# Patient Record
Sex: Female | Born: 1975 | Race: White | Hispanic: No | Marital: Single | Smoking: Current some day smoker
Health system: Southern US, Community
[De-identification: ages and names within clinical notes are randomized; demographics above are authoritative.]

---

## 2013-02-26 ENCOUNTER — Encounter (HOSPITAL_COMMUNITY): Payer: Self-pay | Admitting: Emergency Medicine

## 2013-02-26 ENCOUNTER — Inpatient Hospital Stay (HOSPITAL_COMMUNITY)
Admission: EM | Admit: 2013-02-26 | Discharge: 2013-03-02 | DRG: 158 | Disposition: A | Discharge status: Home / Self Care | Payer: PRIVATE HEALTH INSURANCE | Attending: General Surgery | Admitting: General Surgery

## 2013-02-26 ENCOUNTER — Emergency Department (HOSPITAL_COMMUNITY): Payer: PRIVATE HEALTH INSURANCE

## 2013-02-26 DIAGNOSIS — S8010XA Contusion of unspecified lower leg, initial encounter: Secondary | ICD-10-CM

## 2013-02-26 DIAGNOSIS — S92901A Unspecified fracture of right foot, initial encounter for closed fracture: Secondary | ICD-10-CM | POA: Diagnosis present

## 2013-02-26 DIAGNOSIS — R404 Transient alteration of awareness: Secondary | ICD-10-CM | POA: Diagnosis present

## 2013-02-26 DIAGNOSIS — S93409A Sprain of unspecified ligament of unspecified ankle, initial encounter: Secondary | ICD-10-CM

## 2013-02-26 DIAGNOSIS — S025XXA Fracture of tooth (traumatic), initial encounter for closed fracture: Secondary | ICD-10-CM | POA: Diagnosis present

## 2013-02-26 DIAGNOSIS — S060X9A Concussion with loss of consciousness of unspecified duration, initial encounter: Secondary | ICD-10-CM | POA: Diagnosis present

## 2013-02-26 DIAGNOSIS — S0993XA Unspecified injury of face, initial encounter: Secondary | ICD-10-CM | POA: Diagnosis present

## 2013-02-26 DIAGNOSIS — T07XXXA Unspecified multiple injuries, initial encounter: Secondary | ICD-10-CM

## 2013-02-26 DIAGNOSIS — S92213A Displaced fracture of cuboid bone of unspecified foot, initial encounter for closed fracture: Secondary | ICD-10-CM | POA: Diagnosis present

## 2013-02-26 DIAGNOSIS — D62 Acute posthemorrhagic anemia: Secondary | ICD-10-CM | POA: Diagnosis present

## 2013-02-26 DIAGNOSIS — S92009A Unspecified fracture of unspecified calcaneus, initial encounter for closed fracture: Secondary | ICD-10-CM | POA: Diagnosis present

## 2013-02-26 DIAGNOSIS — S060XAA Concussion with loss of consciousness status unknown, initial encounter: Secondary | ICD-10-CM

## 2013-02-26 DIAGNOSIS — F17200 Nicotine dependence, unspecified, uncomplicated: Secondary | ICD-10-CM | POA: Diagnosis present

## 2013-02-26 DIAGNOSIS — K006 Disturbances in tooth eruption: Secondary | ICD-10-CM

## 2013-02-26 DIAGNOSIS — S93419A Sprain of calcaneofibular ligament of unspecified ankle, initial encounter: Secondary | ICD-10-CM | POA: Diagnosis present

## 2013-02-26 DIAGNOSIS — S01502A Unspecified open wound of oral cavity, initial encounter: Principal | ICD-10-CM | POA: Diagnosis present

## 2013-02-26 DIAGNOSIS — S92253A Displaced fracture of navicular [scaphoid] of unspecified foot, initial encounter for closed fracture: Secondary | ICD-10-CM | POA: Diagnosis present

## 2013-02-26 DIAGNOSIS — S92902P Unspecified fracture of left foot, subsequent encounter for fracture with malunion: Secondary | ICD-10-CM | POA: Diagnosis present

## 2013-02-26 DIAGNOSIS — S8000XA Contusion of unspecified knee, initial encounter: Secondary | ICD-10-CM | POA: Diagnosis present

## 2013-02-26 DIAGNOSIS — S01501A Unspecified open wound of lip, initial encounter: Secondary | ICD-10-CM | POA: Diagnosis present

## 2013-02-26 DIAGNOSIS — S0181XA Laceration without foreign body of other part of head, initial encounter: Secondary | ICD-10-CM

## 2013-02-26 DIAGNOSIS — S9000XA Contusion of unspecified ankle, initial encounter: Secondary | ICD-10-CM | POA: Diagnosis present

## 2013-02-26 LAB — POCT I-STAT, CHEM 8
BUN: 8 mg/dL (ref 6–23)
Calcium, Ion: 1.21 mmol/L (ref 1.12–1.23)
Chloride: 103 mEq/L (ref 96–112)
Creatinine, Ser: 0.7 mg/dL (ref 0.50–1.10)
Glucose, Bld: 114 mg/dL — ABNORMAL HIGH (ref 70–99)
HCT: 44 % (ref 36.0–46.0)
Hemoglobin: 15 g/dL (ref 12.0–15.0)
Potassium: 3.1 mEq/L — ABNORMAL LOW (ref 3.7–5.3)
Sodium: 142 mEq/L (ref 137–147)
TCO2: 22 mmol/L (ref 0–100)

## 2013-02-26 LAB — COMPREHENSIVE METABOLIC PANEL
ALT: 15 U/L (ref 0–35)
AST: 26 U/L (ref 0–37)
Albumin: 4.4 g/dL (ref 3.5–5.2)
Alkaline Phosphatase: 54 U/L (ref 39–117)
Chloride: 102 mEq/L (ref 96–112)
GFR calc non Af Amer: >90 mL/min (ref >90)
Potassium: 3.3 mEq/L — ABNORMAL LOW (ref 3.7–5.3)
Sodium: 141 mEq/L (ref 137–147)
Total Bilirubin: 0.6 mg/dL (ref 0.3–1.2)
Total Protein: 7.9 g/dL (ref 6.0–8.3)

## 2013-02-26 LAB — CDS SEROLOGY

## 2013-02-26 LAB — CBC
HCT: 38.5 % (ref 36.0–46.0)
Hemoglobin: 13.3 g/dL (ref 12.0–15.0)
MCH: 31.5 pg (ref 26.0–34.0)
MCHC: 34.5 g/dL (ref 30.0–36.0)
MCV: 91.2 fL (ref 78.0–100.0)
Platelets: 291 10*3/uL (ref 150–400)
RBC: 4.22 MIL/uL (ref 3.87–5.11)
RDW: 13.1 % (ref 11.5–15.5)
WBC: 16.9 K/uL — ABNORMAL HIGH (ref 4.0–10.5)

## 2013-02-26 LAB — HCG, SERUM, QUALITATIVE: Preg, Serum: NEGATIVE

## 2013-02-26 LAB — COMPREHENSIVE METABOLIC PANEL WITH GFR
BUN: 10 mg/dL (ref 6–23)
CO2: 23 meq/L (ref 19–32)
Calcium: 9.4 mg/dL (ref 8.4–10.5)
Creatinine, Ser: 0.65 mg/dL (ref 0.50–1.10)
GFR calc Af Amer: >90 mL/min (ref >90)
Glucose, Bld: 113 mg/dL — ABNORMAL HIGH (ref 70–99)

## 2013-02-26 LAB — SAMPLE TO BLOOD BANK

## 2013-02-26 LAB — CG4 I-STAT (LACTIC ACID): Lactic Acid, Venous: 3.12 mmol/L — ABNORMAL HIGH (ref 0.5–2.2)

## 2013-02-26 LAB — PROTIME-INR
INR: 0.96 (ref 0.00–1.49)
Prothrombin Time: 12.6 s (ref 11.6–15.2)

## 2013-02-26 MED ORDER — MORPHINE SULFATE 2 MG/ML IJ SOLN
2.0000 mg | INTRAMUSCULAR | Status: DC | PRN
  Administered 2013-02-26 – 2013-02-27 (×6): 2 mg via INTRAVENOUS
  Filled 2013-02-26 (×3): qty 1
  Filled 2013-02-26: qty 2
  Filled 2013-02-26 (×4): qty 1

## 2013-02-26 MED ORDER — PANTOPRAZOLE SODIUM 40 MG IV SOLR
40.0000 mg | Freq: Every day | INTRAVENOUS | Status: DC
  Administered 2013-02-26 – 2013-02-27 (×2): 40 mg via INTRAVENOUS
  Filled 2013-02-26 (×3): qty 40

## 2013-02-26 MED ORDER — ENOXAPARIN SODIUM 40 MG/0.4ML ~~LOC~~ SOLN
40.0000 mg | SUBCUTANEOUS | Status: DC
  Administered 2013-02-27 – 2013-03-02 (×4): 40 mg via SUBCUTANEOUS
  Filled 2013-02-26 (×4): qty 0.4

## 2013-02-26 MED ORDER — SODIUM CHLORIDE 0.9 % IV SOLN
INTRAVENOUS | Status: DC
  Administered 2013-02-26 – 2013-02-27 (×2): via INTRAVENOUS

## 2013-02-26 MED ORDER — FENTANYL CITRATE 0.05 MG/ML IJ SOLN
INTRAMUSCULAR | Status: AC
  Filled 2013-02-26: qty 2

## 2013-02-26 MED ORDER — HYDROCODONE-ACETAMINOPHEN 5-325 MG PO TABS
0.5000 | ORAL_TABLET | ORAL | Status: DC | PRN
  Administered 2013-02-26 – 2013-02-27 (×4): 0.5 via ORAL
  Filled 2013-02-26 (×4): qty 1

## 2013-02-26 MED ORDER — FENTANYL CITRATE 0.05 MG/ML IJ SOLN
50.0000 ug | Freq: Once | INTRAMUSCULAR | Status: AC
  Administered 2013-02-26: 50 ug via INTRAVENOUS

## 2013-02-26 MED ORDER — MORPHINE SULFATE 4 MG/ML IJ SOLN
4.0000 mg | Freq: Once | INTRAMUSCULAR | Status: AC
  Administered 2013-02-26: 4 mg via INTRAVENOUS
  Filled 2013-02-26: qty 1

## 2013-02-26 MED ORDER — ONDANSETRON HCL 4 MG/2ML IJ SOLN
4.0000 mg | Freq: Four times a day (QID) | INTRAMUSCULAR | Status: DC | PRN
Start: 2013-02-26 — End: 2013-03-02
  Administered 2013-02-26: 4 mg via INTRAVENOUS
  Filled 2013-02-26: qty 2

## 2013-02-26 MED ORDER — IOHEXOL 300 MG/ML  SOLN
100.0000 mL | Freq: Once | INTRAMUSCULAR | Status: AC | PRN
  Administered 2013-02-26: 100 mL via INTRAVENOUS

## 2013-02-26 MED ORDER — ONDANSETRON HCL 4 MG PO TABS: 4.0000 mg | ORAL_TABLET | Freq: Four times a day (QID) | ORAL | Status: DC | PRN

## 2013-02-26 MED ORDER — ONDANSETRON HCL 4 MG/2ML IJ SOLN
4.0000 mg | Freq: Once | INTRAMUSCULAR | Status: AC
  Administered 2013-02-26: 4 mg via INTRAVENOUS
  Filled 2013-02-26: qty 2

## 2013-02-26 MED ORDER — HYDROCODONE-ACETAMINOPHEN 5-325 MG PO TABS: ORAL_TABLET | ORAL | Status: DC

## 2013-02-26 MED ORDER — PANTOPRAZOLE SODIUM 40 MG PO TBEC
40.0000 mg | DELAYED_RELEASE_TABLET | Freq: Every day | ORAL | Status: DC
  Filled 2013-02-26: qty 1

## 2013-02-26 NOTE — ED Notes (Signed)
Pt given water per Dr. Oletta LamasGhim

## 2013-02-26 NOTE — ED Notes (Signed)
Pt to CT scan and xray at this time.

## 2013-02-26 NOTE — ED Notes (Signed)
Hourly rounding - PT A/O and speaking in full sentences.Family at bed side.

## 2013-02-26 NOTE — ED Notes (Signed)
Phlebotomy at the bedside  

## 2013-02-26 NOTE — ED Notes (Signed)
Family at beside. Family given emotional support. 

## 2013-02-26 NOTE — ED Notes (Signed)
Pt returned from radiology. Placed back on monitor.  

## 2013-02-26 NOTE — ED Provider Notes (Addendum)
Medical screening examination/treatment/procedure(s) were conducted as a shared visit with non-physician practitioner(s) and myself.  I personally evaluated the patient during the encounter.     Was available and present for key portions of wound repair.    Gavin PoundMichael Y. Oletta LamasGhim, MD 02/26/13 1515  Gavin PoundMichael Y. Josua Ferrebee, MD 02/26/13 1556
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
## 2013-02-26 NOTE — ED Notes (Signed)
Family updated as to patient's status.

## 2013-02-26 NOTE — ED Notes (Signed)
Rob, PA in to suture patient's facial laceration

## 2013-02-26 NOTE — ED Provider Notes (Signed)
I was asked by Dr. Bebe ShaggyWickline to assist Dr. Oletta LamasGhim in laceration repair on this patient.  LACERATION REPAIR Performed by: Roxy HorsemanBROWNING, Edgerrin Correia Authorized by: Roxy HorsemanBROWNING, Amyiah Gaba Consent: Verbal consent obtained. Risks and benefits: risks, benefits and alternatives were discussed Consent given by: patient Patient identity confirmed: provided demographic data Prepped and Draped in normal sterile fashion Wound explored  Laceration Location: right lower lip and chin  Laceration Length: 3 cm  No Foreign Bodies seen or palpated  Anesthesia: local infiltration  Local anesthetic: lidocaine 2% with epinephrine  Anesthetic total: 3 ml  Irrigation method: syringe Amount of cleaning: standard  Skin closure: 6-0 prolene  Number of sutures: 12  Technique: simple  Patient tolerance: Patient tolerated the procedure well with no immediate complications.   LACERATION REPAIR Performed by: Roxy HorsemanBROWNING, Analleli Gierke Authorized by: Roxy HorsemanBROWNING, Arnie Clingenpeel Consent: Verbal consent obtained. Risks and benefits: risks, benefits and alternatives were discussed Consent given by: patient Patient identity confirmed: provided demographic data Prepped and Draped in normal sterile fashion Wound explored  Laceration Location: right lower interior lip  Laceration Length: 3cm  No Foreign Bodies seen or palpated  Anesthesia: local infiltration  Local anesthetic: lidocaine 2% with epinephrine  Anesthetic total: 1 ml  Irrigation method: syringe Amount of cleaning: standard  Skin closure: 5-0 rapid vicryl absorbable  Number of sutures: 8  Technique: simple  Patient tolerance: Patient tolerated the procedure well with no immediate complications.   Both lacerations came together nicely.  Wound healing information given.  Suture removal in 5 days for exterior lip.    Roxy Horsemanobert Caley Volkert, PA-C 02/26/13 1511

## 2013-02-26 NOTE — ED Notes (Signed)
Vital signs stable. 

## 2013-02-27 ENCOUNTER — Observation Stay (HOSPITAL_COMMUNITY): Payer: PRIVATE HEALTH INSURANCE

## 2013-02-27 LAB — CBC
HCT: 30.6 % — ABNORMAL LOW (ref 36.0–46.0)
HEMATOCRIT: 30.6 % — AB (ref 36.0–46.0)
Hemoglobin: 10.4 g/dL — ABNORMAL LOW (ref 12.0–15.0)
Hemoglobin: 10.4 g/dL — ABNORMAL LOW (ref 12.0–15.0)
MCH: 31.4 pg (ref 26.0–34.0)
MCH: 31.3 pg (ref 26.0–34.0)
MCHC: 34 g/dL (ref 30.0–36.0)
MCHC: 34 g/dL (ref 30.0–36.0)
MCV: 92.4 fL (ref 78.0–100.0)
MCV: 92.2 fL (ref 78.0–100.0)
Platelets: 209 10*3/uL (ref 150–400)
Platelets: 193 10*3/uL (ref 150–400)
RBC: 3.31 MIL/uL — ABNORMAL LOW (ref 3.87–5.11)
RBC: 3.32 MIL/uL — ABNORMAL LOW (ref 3.87–5.11)
RDW: 13.6 % (ref 11.5–15.5)
RDW: 13.6 % (ref 11.5–15.5)
WBC: 11.4 10*3/uL — AB (ref 4.0–10.5)
WBC: 9.4 10*3/uL (ref 4.0–10.5)

## 2013-02-27 LAB — BASIC METABOLIC PANEL
BUN: 11 mg/dL (ref 6–23)
CHLORIDE: 103 meq/L (ref 96–112)
CO2: 23 mEq/L (ref 19–32)
CREATININE: 0.65 mg/dL (ref 0.50–1.10)
Calcium: 8.7 mg/dL (ref 8.4–10.5)
GFR calc non Af Amer: >90 mL/min (ref >90)
GLUCOSE: 102 mg/dL — AB (ref 70–99)
Potassium: 3.8 mEq/L (ref 3.7–5.3)
Sodium: 139 mEq/L (ref 137–147)

## 2013-02-27 MED ORDER — BIOTENE DRY MOUTH MT LIQD
15.0000 mL | OROMUCOSAL | Status: DC | PRN
  Administered 2013-02-28: 15 mL via OROMUCOSAL

## 2013-02-27 MED ORDER — ENSURE COMPLETE PO LIQD
237.0000 mL | Freq: Four times a day (QID) | ORAL | Status: DC
  Administered 2013-02-28 (×2): 237 mL via ORAL

## 2013-02-27 MED ORDER — BENZOCAINE 10 % MT GEL
Freq: Four times a day (QID) | OROMUCOSAL | Status: DC | PRN
  Administered 2013-02-28: 09:00:00 via OROMUCOSAL
  Filled 2013-02-27 (×2): qty 9.4

## 2013-02-27 NOTE — Progress Notes (Signed)
Nutrition Brief Note:  Notified pt unable to consume food sent from cafeteria. Pt unable to chew due to pain. Dental consult pending.  Will downgrade diet to Dysphagia I with Thin Liquids. Offer Ensure Complete po QID, each supplement provides 350 kcal and 13 grams of protein.  Follow up to determine tolerance.   Kendell BaneHeather Paige Vanderwoude RD, LDN, CNSC [phone] Pager [phone] After Hours Pager

## 2013-02-27 NOTE — Progress Notes (Signed)
Ankles are quite tender. If does not mobilize well with therapies may need Ortho eval. I also discussed adjusting their upcomming travel plans. Patient examined and I agree with the assessment and plan  Violeta GelinasBurke Cherron Blitzer, MD, MPH, FACS Pager: [phone]  02/27/2013 11:52 AM

## 2013-02-27 NOTE — Progress Notes (Signed)
Orthopedic Tech Progress Note Patient Details:  Jaclyn PumaBobbie Montgomery 09-Aug-1975 [id number]  Ortho Devices Type of Ortho Device: ASO Ortho Device/Splint Location: Bilateral ASOs Ortho Device/Splint Interventions: Application   Cammer, Mickie BailJennifer Carol 02/27/2013, 11:18 AM
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
## 2013-02-27 NOTE — Progress Notes (Signed)
UR completed 
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
## 2013-02-27 NOTE — Progress Notes (Signed)
Orthopedic Tech Progress Note Patient Details:  Jaclyn Montgomery [birth year] [id number]  Ortho Devices Type of Ortho Device: Stirrup splint;Post (short leg) splint Ortho Device/Splint Location: Bilateral ASOs Ortho Device/Splint Interventions: Application   Cammer, Mickie BailJennifer Carol 02/27/2013, 2:57 PM

## 2013-02-28 DIAGNOSIS — S92901A Unspecified fracture of right foot, initial encounter for closed fracture: Secondary | ICD-10-CM | POA: Diagnosis present

## 2013-02-28 DIAGNOSIS — S0181XA Laceration without foreign body of other part of head, initial encounter: Secondary | ICD-10-CM | POA: Diagnosis present

## 2013-02-28 DIAGNOSIS — S92902P Unspecified fracture of left foot, subsequent encounter for fracture with malunion: Secondary | ICD-10-CM | POA: Diagnosis present

## 2013-02-28 DIAGNOSIS — S0993XA Unspecified injury of face, initial encounter: Secondary | ICD-10-CM | POA: Diagnosis present

## 2013-02-28 DIAGNOSIS — D62 Acute posthemorrhagic anemia: Secondary | ICD-10-CM | POA: Diagnosis not present

## 2013-02-28 MED ORDER — HYDROCODONE-ACETAMINOPHEN 10-325 MG PO TABS
0.5000 | ORAL_TABLET | ORAL | Status: DC | PRN
  Administered 2013-02-28: 1 via ORAL
  Filled 2013-02-28: qty 1

## 2013-02-28 MED ORDER — MORPHINE SULFATE 2 MG/ML IJ SOLN: 2.0000 mg | INTRAMUSCULAR | Status: DC | PRN

## 2013-02-28 MED ORDER — ENSURE COMPLETE PO LIQD
237.0000 mL | Freq: Three times a day (TID) | ORAL | Status: DC
  Administered 2013-02-28 – 2013-03-02 (×6): 237 mL via ORAL

## 2013-02-28 MED ORDER — BACITRACIN ZINC 500 UNIT/GM EX OINT
1.0000 "application " | TOPICAL_OINTMENT | Freq: Two times a day (BID) | CUTANEOUS | Status: DC
  Administered 2013-02-28 – 2013-03-02 (×5): 1 via TOPICAL
  Filled 2013-02-28 (×2): qty 28.35

## 2013-02-28 MED ORDER — TRAMADOL HCL 50 MG PO TABS
100.0000 mg | ORAL_TABLET | Freq: Four times a day (QID) | ORAL | Status: DC
  Administered 2013-02-28 – 2013-03-02 (×7): 100 mg via ORAL
  Filled 2013-02-28 (×7): qty 2

## 2013-02-28 MED ORDER — BOOST / RESOURCE BREEZE PO LIQD
1.0000 | Freq: Two times a day (BID) | ORAL | Status: DC
  Administered 2013-03-01: 1 via ORAL
  Administered 2013-03-01: 0.9875 via ORAL
  Administered 2013-03-02: 1 via ORAL

## 2013-02-28 MED ORDER — NAPROXEN 250 MG PO TABS
500.0000 mg | ORAL_TABLET | Freq: Two times a day (BID) | ORAL | Status: DC
  Administered 2013-02-28 – 2013-03-02 (×4): 500 mg via ORAL
  Filled 2013-02-28: qty 1
  Filled 2013-02-28 (×5): qty 2
  Filled 2013-02-28: qty 1
  Filled 2013-02-28: qty 2
  Filled 2013-02-28: qty 1

## 2013-02-28 NOTE — Progress Notes (Signed)
Orthopedic Tech Progress Note Patient Details:  Jaclyn PumaBobbie Montgomery 05/23/1975 [id number]  Ortho Devices Type of Ortho Device: CAM walker Ortho Device/Splint Location: bilateral Ortho Device/Splint Interventions: Application   Dejaun Vidrio 02/28/2013, 3:05 PM
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
## 2013-03-01 MED ORDER — WHITE PETROLATUM GEL
Status: AC
  Administered 2013-03-01: 0.2
  Filled 2013-03-01: qty 5

## 2013-03-01 NOTE — Progress Notes (Signed)
PT Cancellation Note  Patient Details Name: Merrilyn PumaBobbie Bogosian MRN: [id number] DOB: 03-15-1975   Cancelled Treatment:    Reason Eval/Treat Not Completed: Other (comment). Pt working with OT at the time. Will come back at a later time.    Donnamarie PoagWest, Vandana Haman [place] Springs[place], [place] Carolina[place] [phone] 03/01/2013, 2:47 PM
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
## 2013-03-01 NOTE — Progress Notes (Signed)
Once ortho changes splint, we will see if she can get up on BLE cam walker boots. F/U at Dr Frederik Pearwsley's office as well. Patient examined and I agree with the assessment and plan  Jaclyn GelinasBurke Arzell Mcgeehan, MD, MPH, FACS Pager: [phone]  03/01/2013 1:44 PM
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
## 2013-03-02 ENCOUNTER — Encounter (INDEPENDENT_AMBULATORY_CARE_PROVIDER_SITE_OTHER): Payer: Self-pay

## 2013-03-02 ENCOUNTER — Encounter (INDEPENDENT_AMBULATORY_CARE_PROVIDER_SITE_OTHER): Payer: Self-pay | Admitting: Orthopedic Surgery

## 2013-03-02 MED ORDER — TRAMADOL HCL 50 MG PO TABS: 50.0000 mg | ORAL_TABLET | Freq: Four times a day (QID) | ORAL | Status: AC | PRN
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
## 2013-03-02 NOTE — Clinical Social Work Note (Signed)
Clinical Social Worker continuing to follow patient for support and discharge planning needs.  CM is working on patient accurate documentation for [employer].  Patient is scheduled to discharge home with her friends today and is agreeable with this plan.  Patient friends to provide transportation.  Patient given phone number to contact dental office regarding follow up prior to return to [place].  Clinical Social Worker will sign off for now as social work intervention is no longer needed. Please consult us again if new need arises.  Macario GoldsJesse Green [place], [place] [phone]

## 2013-03-02 NOTE — Progress Notes (Signed)
Discharge patient home . Home discharge instruction given, patient is alert and oriented, able to transfer self from bed to chair, ambulate with walker.No questiond verbalized with home instructions, very thankful to staff.
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
## 2013-03-02 NOTE — Discharge Summary (Signed)
Seen and agree  

## 2013-03-02 NOTE — Discharge Instructions (Addendum)
Orthopaedic Trauma Service Discharge Instructions,   General Discharge Instructions  WEIGHT BEARING STATUS: weightbearing as tolerated bilateral lower extremities with CAM boots  RANGE OF MOTION/ACTIVITY: Range of motion as tolerated B hips, knees and ankles. Ok to remove CAM boots for ROM exercises   ICE AND ELEVATE INJURED/OPERATIVE EXTREMITY  Using ice and elevating the injured extremity above your heart can help with swelling and pain control.  Icing in a pulsatile fashion, such as 20 minutes on and 20 minutes off, can be followed.    Do not place ice directly on skin. Make sure there is a barrier between to skin and the ice pack.    Using frozen items such as frozen peas works well as the conform nicely to the are that needs to be iced.  USE AN ACE WRAP OR TED HOSE FOR SWELLING CONTROL  In addition to icing and elevation, Ace wraps or TED hose are used to help limit and resolve swelling.  It is recommended to use Ace wraps or TED hose until you are informed to stop.    When using Ace Wraps start the wrapping distally (farthest away from the body) and wrap proximally (closer to the body)   Example: If you had surgery on your leg or thing and you do not have a splint on, start the ace wrap at the toes and work your way up to the thigh        If you had surgery on your upper extremity and do not have a splint on, start the ace wrap at your fingers and work your way up to the upper arm  IF YOU ARE IN A CAM BOOT (BLACK BOOT)  You may remove boot periodically.  Wash the liner of the boot regularly and wear a sock when wearing the boot. It is recommended that you sleep in the boot until told otherwise  CALL THE OFFICE WITH ANY QUESTIONS OR CONCERTS: [phone]     Wash wounds daily in shower with soap and water. Apply antibiotic ointment (e.g. Neosporin) twice daily and as needed to keep moist.
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
## 2013-03-02 NOTE — Progress Notes (Signed)
Seen and agree  
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
## 2013-03-05 ENCOUNTER — Encounter (INDEPENDENT_AMBULATORY_CARE_PROVIDER_SITE_OTHER): Payer: Self-pay

## 2013-03-09 ENCOUNTER — Telehealth (HOSPITAL_COMMUNITY): Payer: Self-pay | Admitting: Lab

## 2013-03-09 NOTE — Telephone Encounter (Signed)
Left message

## 2013-03-12 NOTE — Telephone Encounter (Signed)
Pt will let us know when she'll come to pick up letter and we will leave it at information desk inside main entrance.

## 2013-03-14 ENCOUNTER — Telehealth (HOSPITAL_COMMUNITY): Payer: Self-pay | Admitting: Lab

## 2013-03-14 NOTE — Telephone Encounter (Signed)
Jaclyn Montgomery Jaclyn Montgomery our Case manager attempted to call the patient to arrange for pick-up or a faxed copy of her travel letter, but the patient did not answer.  Jaclyn Montgomery left a voice mail for her to call back.  Last time Jaclyn Montgomery [name] talked to the patient she was pending letting Jaclyn Montgomery know when she was coming by the hospital so we could take the letter down to the front entrance volunteer desk for her to pick up.

## 2013-03-15 NOTE — Telephone Encounter (Signed)
Letter faxed per patient's instructions by Carlyle LipaMichelle Bryson.

## 2013-03-25 NOTE — Progress Notes (Signed)
I have reviewed and discussed in detail with Mr. Jaclyn Montgomery the patient's clinical progress today, examination findings, and I formulated the plan outlined above.  Myrene GalasMichael Millicent Blazejewski, MD [facility] [phone] (p)

## 2013-03-25 NOTE — Progress Notes (Signed)
I saw and examined the patient with Mr. Paul, communicating the findings and plan noted above.  Ammy Lienhard, MD [facility] [phone] (p)  

## 2013-03-27 ENCOUNTER — Encounter (INDEPENDENT_AMBULATORY_CARE_PROVIDER_SITE_OTHER): Payer: Self-pay | Admitting: Orthopedic Surgery
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
